# Patient Record
Sex: Female | Born: 1970 | Race: Black or African American | Hispanic: No | Marital: Single
Health system: Southern US, Community
[De-identification: ages and names within clinical notes are randomized; demographics above are authoritative.]

---

## 1998-12-06 ENCOUNTER — Ambulatory Visit (HOSPITAL_COMMUNITY): Admission: RE | Admit: 1998-12-06 | Discharge: 1998-12-06 | Payer: Self-pay | Admitting: Family Medicine

## 1998-12-06 ENCOUNTER — Encounter: Payer: Self-pay | Admitting: Family Medicine

## 1999-01-20 ENCOUNTER — Encounter: Payer: Self-pay | Admitting: Family Medicine

## 1999-01-20 ENCOUNTER — Ambulatory Visit (HOSPITAL_COMMUNITY): Admission: RE | Admit: 1999-01-20 | Discharge: 1999-01-20 | Payer: Self-pay | Admitting: Family Medicine

## 1999-02-09 ENCOUNTER — Encounter: Payer: Self-pay | Admitting: Neurology

## 1999-02-09 ENCOUNTER — Ambulatory Visit (HOSPITAL_COMMUNITY): Admission: RE | Admit: 1999-02-09 | Discharge: 1999-02-09 | Payer: Self-pay | Admitting: Neurology

## 1999-02-16 ENCOUNTER — Encounter: Admission: RE | Admit: 1999-02-16 | Discharge: 1999-05-16 | Payer: Self-pay | Admitting: Anesthesiology

## 1999-05-16 ENCOUNTER — Encounter: Admission: RE | Admit: 1999-05-16 | Discharge: 1999-08-14 | Payer: Self-pay | Admitting: Anesthesiology

## 1999-05-25 ENCOUNTER — Encounter: Payer: Self-pay | Admitting: Family Medicine

## 1999-05-25 ENCOUNTER — Ambulatory Visit (HOSPITAL_COMMUNITY): Admission: RE | Admit: 1999-05-25 | Discharge: 1999-05-25 | Payer: Self-pay | Admitting: Family Medicine

## 1999-08-19 ENCOUNTER — Encounter: Admission: RE | Admit: 1999-08-19 | Discharge: 1999-11-17 | Payer: Self-pay | Admitting: Anesthesiology

## 1999-12-09 ENCOUNTER — Encounter: Admission: RE | Admit: 1999-12-09 | Discharge: 2000-03-08 | Payer: Self-pay | Admitting: Anesthesiology

## 2000-04-09 ENCOUNTER — Encounter: Admission: RE | Admit: 2000-04-09 | Discharge: 2000-07-08 | Payer: Self-pay | Admitting: Anesthesiology

## 2000-07-03 ENCOUNTER — Encounter: Admission: RE | Admit: 2000-07-03 | Discharge: 2000-10-01 | Payer: Self-pay | Admitting: Anesthesiology

## 2000-08-06 ENCOUNTER — Encounter: Admission: RE | Admit: 2000-08-06 | Discharge: 2000-11-04 | Payer: Self-pay | Admitting: Anesthesiology

## 2000-11-02 ENCOUNTER — Encounter: Admission: RE | Admit: 2000-11-02 | Discharge: 2001-01-31 | Payer: Self-pay | Admitting: Anesthesiology

## 2001-02-04 ENCOUNTER — Encounter: Admission: RE | Admit: 2001-02-04 | Discharge: 2001-05-05 | Payer: Self-pay | Admitting: Anesthesiology

## 2001-05-27 ENCOUNTER — Encounter: Admission: RE | Admit: 2001-05-27 | Discharge: 2001-06-29 | Payer: Self-pay | Admitting: Anesthesiology

## 2004-05-11 ENCOUNTER — Ambulatory Visit (HOSPITAL_COMMUNITY): Admission: RE | Admit: 2004-05-11 | Discharge: 2004-05-11 | Payer: Self-pay | Admitting: Preventative Medicine

## 2004-05-12 ENCOUNTER — Ambulatory Visit (HOSPITAL_COMMUNITY): Admission: RE | Admit: 2004-05-12 | Discharge: 2004-05-12 | Payer: Self-pay | Admitting: Urology

## 2004-06-28 ENCOUNTER — Ambulatory Visit (HOSPITAL_COMMUNITY): Admission: RE | Admit: 2004-06-28 | Discharge: 2004-06-28 | Payer: Self-pay | Admitting: Urology

## 2004-12-13 ENCOUNTER — Ambulatory Visit (HOSPITAL_COMMUNITY): Admission: RE | Admit: 2004-12-13 | Discharge: 2004-12-13 | Payer: Self-pay | Admitting: Urology

## 2011-03-23 ENCOUNTER — Other Ambulatory Visit (HOSPITAL_COMMUNITY): Payer: Self-pay | Admitting: Ophthalmology

## 2011-03-23 ENCOUNTER — Other Ambulatory Visit: Payer: Self-pay | Admitting: Internal Medicine

## 2011-03-23 DIAGNOSIS — H5347 Heteronymous bilateral field defects: Secondary | ICD-10-CM

## 2011-03-30 ENCOUNTER — Ambulatory Visit (HOSPITAL_COMMUNITY)
Admission: RE | Admit: 2011-03-30 | Discharge: 2011-03-30 | Disposition: A | Discharge status: Home / Self Care | Payer: 59 | Source: Ambulatory Visit | Attending: Ophthalmology | Admitting: Ophthalmology

## 2011-03-30 DIAGNOSIS — G939 Disorder of brain, unspecified: Secondary | ICD-10-CM | POA: Insufficient documentation

## 2011-03-30 DIAGNOSIS — H538 Other visual disturbances: Secondary | ICD-10-CM | POA: Insufficient documentation

## 2011-03-30 DIAGNOSIS — H5347 Heteronymous bilateral field defects: Secondary | ICD-10-CM

## 2011-03-30 MED ORDER — GADOBENATE DIMEGLUMINE 529 MG/ML IV SOLN: 9.0000 mL | Freq: Once | INTRAVENOUS | Status: AC | PRN

## 2011-06-09 ENCOUNTER — Encounter (HOSPITAL_COMMUNITY)
Admission: RE | Admit: 2011-06-09 | Discharge: 2011-06-09 | Disposition: A | Discharge status: Home / Self Care | Payer: 59 | Source: Ambulatory Visit | Attending: Neurosurgery | Admitting: Neurosurgery

## 2011-06-09 LAB — COMPREHENSIVE METABOLIC PANEL
ALT: 15 U/L (ref 0–35)
AST: 13 U/L (ref 0–37)
Alkaline Phosphatase: 59 U/L (ref 39–117)
CO2: 33 mEq/L — ABNORMAL HIGH (ref 19–32)
Glucose, Bld: 87 mg/dL (ref 70–99)
Sodium: 142 mEq/L (ref 135–145)
Total Bilirubin: 0.2 mg/dL — ABNORMAL LOW (ref 0.3–1.2)

## 2011-06-09 LAB — CBC
HCT: 32.4 % — ABNORMAL LOW (ref 36.0–46.0)
MCH: 31.3 pg (ref 26.0–34.0)
MCHC: 34.9 g/dL (ref 30.0–36.0)
Platelets: 232 10*3/uL (ref 150–400)
RDW: 13.4 % (ref 11.5–15.5)
WBC: 12.5 10*3/uL — ABNORMAL HIGH (ref 4.0–10.5)

## 2011-06-09 LAB — ABO/RH: ABO/RH(D): B POS

## 2011-06-09 LAB — TYPE AND SCREEN

## 2011-06-19 ENCOUNTER — Inpatient Hospital Stay (HOSPITAL_COMMUNITY)
Admission: RE | Admit: 2011-06-19 | Discharge: 2011-06-23 | DRG: 614 | Disposition: A | Discharge status: Home / Self Care | Payer: 59 | Source: Ambulatory Visit | Attending: Neurosurgery | Admitting: Neurosurgery

## 2011-06-19 ENCOUNTER — Other Ambulatory Visit: Payer: Self-pay | Admitting: Neurosurgery

## 2011-06-19 DIAGNOSIS — D352 Benign neoplasm of pituitary gland: Principal | ICD-10-CM | POA: Diagnosis present

## 2011-06-19 DIAGNOSIS — Z6841 Body Mass Index (BMI) 40.0 and over, adult: Secondary | ICD-10-CM

## 2011-06-19 DIAGNOSIS — Z01812 Encounter for preprocedural laboratory examination: Secondary | ICD-10-CM

## 2011-06-19 DIAGNOSIS — E669 Obesity, unspecified: Secondary | ICD-10-CM | POA: Diagnosis present

## 2011-06-19 DIAGNOSIS — R112 Nausea with vomiting, unspecified: Secondary | ICD-10-CM | POA: Diagnosis present

## 2011-06-19 DIAGNOSIS — E079 Disorder of thyroid, unspecified: Secondary | ICD-10-CM | POA: Diagnosis present

## 2011-06-19 DIAGNOSIS — R51 Headache: Secondary | ICD-10-CM | POA: Diagnosis present

## 2011-06-20 LAB — BASIC METABOLIC PANEL
BUN: 20 mg/dL (ref 6–23)
CO2: 30 mEq/L (ref 19–32)
Calcium: 9.5 mg/dL (ref 8.4–10.5)
Calcium: 9.7 mg/dL (ref 8.4–10.5)
Chloride: 99 mEq/L (ref 96–112)
Chloride: 106 mEq/L (ref 96–112)
Creatinine, Ser: 0.86 mg/dL (ref 0.50–1.10)
Creatinine, Ser: 0.95 mg/dL (ref 0.50–1.10)
GFR calc non Af Amer: >60 mL/min (ref >60)
Glucose, Bld: 143 mg/dL — ABNORMAL HIGH (ref 70–99)

## 2011-06-20 LAB — OSMOLALITY: Osmolality: 288 mOsm/kg (ref 275–300)

## 2011-06-21 LAB — OSMOLALITY: Osmolality: 319 mOsm/kg — ABNORMAL HIGH (ref 275–300)

## 2011-06-21 LAB — BASIC METABOLIC PANEL
CO2: 34 mEq/L — ABNORMAL HIGH (ref 19–32)
CO2: 34 mEq/L — ABNORMAL HIGH (ref 19–32)
Calcium: 9.1 mg/dL (ref 8.4–10.5)
Chloride: 107 mEq/L (ref 96–112)
Creatinine, Ser: 0.83 mg/dL (ref 0.50–1.10)
GFR calc Af Amer: >60 mL/min (ref >60)
GFR calc Af Amer: >60 mL/min (ref >60)
GFR calc non Af Amer: >60 mL/min (ref >60)
GFR calc non Af Amer: >60 mL/min (ref >60)
Glucose, Bld: 128 mg/dL — ABNORMAL HIGH (ref 70–99)
Sodium: 152 mEq/L — ABNORMAL HIGH (ref 135–145)
Sodium: 147 mEq/L — ABNORMAL HIGH (ref 135–145)

## 2011-06-22 LAB — BASIC METABOLIC PANEL
BUN: 19 mg/dL (ref 6–23)
CO2: 34 mEq/L — ABNORMAL HIGH (ref 19–32)
Chloride: 104 mEq/L (ref 96–112)
Creatinine, Ser: 0.82 mg/dL (ref 0.50–1.10)
Creatinine, Ser: 0.78 mg/dL (ref 0.50–1.10)
Sodium: 144 mEq/L (ref 135–145)

## 2011-06-22 LAB — OSMOLALITY: Osmolality: 298 mOsm/kg (ref 275–300)

## 2011-06-23 LAB — BASIC METABOLIC PANEL
BUN: 17 mg/dL (ref 6–23)
CO2: 34 mEq/L — ABNORMAL HIGH (ref 19–32)
Calcium: 9.5 mg/dL (ref 8.4–10.5)
Creatinine, Ser: 0.8 mg/dL (ref 0.50–1.10)
GFR calc non Af Amer: >60 mL/min (ref >60)
Glucose, Bld: 92 mg/dL (ref 70–99)
Potassium: 4 mEq/L (ref 3.5–5.1)

## 2011-06-23 LAB — OSMOLALITY, URINE: Osmolality, Ur: 326 mOsm/kg — ABNORMAL LOW (ref 390–1090)

## 2011-06-26 NOTE — Op Note (Signed)
[name], [name] NO.:  [id number]  MEDICAL RECORD NO.:  [id number]  LOCATION:  3109                         FACILITY:  [facility]  PHYSICIAN:  Cristi Loron, M.D.DATE OF BIRTH:  Nov 19, 1970  DATE OF PROCEDURE:  06/19/2011 DATE OF DISCHARGE:                              OPERATIVE REPORT   BRIEF HISTORY:  The patient is a 40 year old black female who has noted vision loss.  She was worked up with a brain MRI which demonstrated a large sellar and suprasellar tumor.  I discussed various treatment options with the patient.  She underwent an endocrinologic and ophthalmologic workup.  I recommend she undergo a transsphenoidal resection of the tumor.  The patient has outweighed the risks, benefits and alternatives of surgery and decided to proceed with the operation.  PREOPERATIVE DIAGNOSIS:  Pituitary tumor.  POSTOPERATIVE DIAGNOSIS:  Pituitary tumor.  PROCEDURE:  Transsphenoidal resection of pituitary tumor using microdissection.  SURGEON:  Cristi Loron, MD, Kristine Garbe. Ezzard Standing, MD.  ASSISTANT:  None.  ANESTHESIA:  General endotracheal.  ESTIMATED BLOOD LOSS:  100 mL.  SPECIMENS:  Tumor.  DRAINS:  None.  COMPLICATIONS:  None.  DESCRIPTION OF PROCEDURE:  The patient was brought to the operating room by the anesthesia team.  General endotracheal anesthesia was induced. The patient remained in supine position.  The [place] three-point headrest was applied over calvarium.  The patient was positioned with the neck slightly extended.  At this point, Dr. Ezzard Standing performed exposure surgery.  So for further details for the operation, please refer to his operative note.  After Dr. Ezzard Standing had placed a retractor and exposed the sphenoid sinus and the floor of sella, I took over the operation.  I carefully dissected through down to the floor of the sella.  The floor was quite thin with a thin rim of bone.  I removed this using with a  pituitary forceps as well as the Kerrison punches exposing the dura.  I then incised the dura in a cruciate fashion with the 15 blade scalpel, medially protruding was some tumor.  We obtained multiple specimens and sent to the pathologist for permanent section.  I then used the suction irrigation and the various curettes to perform a gross total resection of the tumor.  We carefully dissected with the curettes and we reassessed this and removed all tumor.  Upon doing this, we could see the arachnoid "poaching" down into the sella.  We then obtained hemostasis using electrocautery and  irrigated the wound with bacitracin solution.  We did not see any spinal fluid leakage.  I made an incision in the patient's right upper quadrant.  Removed a piece of adipose tissue from the cutaneous space.  We then placed this fat in the sella and at this point Dr. Ezzard Standing took over the operation and performed the closure.  After the closure, the patient was subsequently returned to anesthesia team and transported to the postanesthesia care unit in stablecondition.  All sponge, instrument, and needle counts were correct at the end of this case.     Cristi Loron, M.D.     JDJ/MEDQ  D:  06/19/2011  T:  06/20/2011  Job:  [id number]  Electronically Signed by Tressie Stalker M.D. on 06/26/2011 07:41:45 AM

## 2011-07-13 NOTE — Discharge Summary (Signed)
  NAMEJAELLA, Erica [name] NO.:  [id number]  MEDICAL RECORD NO.:  [id number]  LOCATION:  3036                         FACILITY:  [facility]  PHYSICIAN:  Cristi Loron, M.D.DATE OF BIRTH:  24-Jan-1971  DATE OF ADMISSION:  06/19/2011 DATE OF DISCHARGE:  06/23/2011                              DISCHARGE SUMMARY   BRIEF HISTORY:  The patient is a 39 year old black female who was noted to have visual loss.  She was worked up with a brain MRI, which demonstrated a large sellar and suprasellar lesion.  I discussed the various treatment with the patient.  The patient underwent endocrinologic and ophthalmologic workup.  I recommend she undergo transsphenoidal resection of the tumor.  The patient has weighed the risks, benefits and alternatives of surgery and decided to proceed with the operation.  For further details of this admission, please refer to typed history and physical.  HOSPITAL COURSE:  I admitted the patient to [facility] on June 19, 2011.  On the day of admission, Dr. Narda Bonds and I performed a gross-total resection of the tumor via a transsphenoidal approach.  The surgery went well (for full details of this operation, please refer to typed and operative note).  POSTOPERATIVE COURSE:  The patient's postoperative course was as follows.  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 intake.  We consulted to Dr. Talmage Nap who saw the patient and started her on 0.1 mg of DDAVP p.o. b.i.d.  The remainder of the patient's hospital course was unremarkable.  She was discharged to home on June 23, 2011.  DISCHARGE INSTRUCTIONS:  The patient was instructed to follow up with Dr. Talmage Nap next week, follow up me in 2 weeks, and follow up with Dr. Ezzard Standing per his instructions.  FINAL DIAGNOSIS:  Pituitary macroadenoma.  PROCEDURE PERFORMED:  Transsphenoidal resection  of pituitary macroadenoma using microdissection.     Cristi Loron, M.D.     JDJ/MEDQ  D:  07/06/2011  T:  07/06/2011  Job:  [id number]  cc:   Dorisann Frames, M.D.  Electronically Signed by Tressie Stalker M.D. on 07/13/2011 09:00:05 PM

## 2011-07-27 NOTE — Op Note (Signed)
Erica [name], [name]               ACCOUNT NO.:  [id number]  MEDICAL RECORD NO.:  [id number]  LOCATION:  3109                         FACILITY:  [facility]  PHYSICIAN:  Kristine Garbe. Ezzard Standing, M.D.DATE OF BIRTH:  12-31-1970  DATE OF PROCEDURE:  06/19/2011 DATE OF DISCHARGE:                              OPERATIVE REPORT   PREOPERATIVE DIAGNOSIS:  Pituitary microadenoma.  POSTOPERATIVE DIAGNOSIS:  Pituitary microadenoma.  OPERATION PERFORMED:  The transseptal transsphenoidal resection of pituitary adenoma.  SURGEON:  Kristine Garbe. Ezzard Standing, MD  CO-SURGEON:  Cristi Loron, MD  ANESTHESIA:  General endotracheal.  COMPLICATIONS:  None.  CLINICAL NOTE:  Erica [name] is a 40 year old black female who started developing some headaches and visual changes in February of this past year.  She was initially seen by optometrist and treated with glasses. She noticed some progressive change in her vision with some bilateraltemporal hemianopsia on followup evaluation.  She underwent an MRI scan which showed a large pituitary mass, consistent with probable pituitary macroadenoma.  She had some extension of the mass in the left cavernous sinus with finding of the optic chiasm.  She was taken to the operating room at this time by Dr. Lovell Sheehan and myself for transeptal transsphenoidal resection of pituitary adenoma.  DESCRIPTION OF PROCEDURE:  The patient was brought to the operating room and was positioned by Dr. Lovell Sheehan and tongs to secure head.  The nose and abdomen was prepped with Betadine solution and draped out with sterile towels.  Nose was then further prepped with cotton pledgets soaked in Afrin and septum and floor of the nose was injected with Xylocaine with epinephrine for hemostasis.  I performed the initial procedure with exposure of the sella.  I first made an extended incision along the cartilaginous septum on the right side, extended down to the floor of the nose.   Mucoperichondrial and mucoperiosteal flaps were elevated off the right side of the septum and extended down to the floor of the nose.  Additional elevation was performed along the left floor of the nose.  The anterior 2-3 cm of cartilaginous septum which was preserved was dislocated off the maxillary crest and pushed into the left airway and then the mucoperiosteal flaps were elevated off either side of the posterior bony septum.  Bony septum was removed back to the base of the sphenoid sinus.  [facility] retractors were then placed. Using the 2-mm osteotome, the sphenoidotomy was performed.  The sphenoidotomy was enlarged to expose the entire sphenoid sinus in the sellar mass to the roof of the sphenoid sinus.  At this point, Dr. Lovell Sheehan performed a resection of the pituitary mass and following resection of the pituitary mass and filling the sella cavity and sphenoid sinus with previously harvested abdominal fat.  I was called back in to close the approach.  The cartilaginous septum was placed back on the maxillary crest in midline.  The hemitransfixion incision was closed with interrupted 4-0 chromic sutures.  The septum was then basted with a 3-0 chromic suture.  Dural splints were secured either side of the septum with a 4-0 nylon suture.  Elongated Merocel packs were placed in either side of the septum and soaked in  bacitracin ointment, hydrated with saline.  Oropharynx was suctioned of any remaining blood.  This completed the procedure.  [name] was awoken from anesthesia and transferred to the recovery room.  Postoperatively, doing well.  DISPOSITION:  She will be admitted for observation in the NICU.  We will plan on removing the Merocel nasal packs in 2-3 days.          ______________________________ Kristine Garbe Ezzard Standing, M.D.     CEN/MEDQ  D:  06/19/2011  T:  06/19/2011  Job:  [id number]  cc:   Cristi Loron, M.D.  Electronically Signed by Dillard Cannon M.D. on  07/27/2011 01:28:52 AM

## 2011-09-13 ENCOUNTER — Other Ambulatory Visit: Payer: Self-pay | Admitting: Neurosurgery

## 2011-09-13 DIAGNOSIS — D497 Neoplasm of unspecified behavior of endocrine glands and other parts of nervous system: Secondary | ICD-10-CM

## 2011-09-18 ENCOUNTER — Ambulatory Visit
Admission: RE | Admit: 2011-09-18 | Discharge: 2011-09-18 | Disposition: A | Discharge status: Home / Self Care | Payer: 59 | Source: Ambulatory Visit | Attending: Neurosurgery | Admitting: Neurosurgery

## 2011-09-18 DIAGNOSIS — D497 Neoplasm of unspecified behavior of endocrine glands and other parts of nervous system: Secondary | ICD-10-CM

## 2011-09-18 MED ORDER — GADOBENATE DIMEGLUMINE 529 MG/ML IV SOLN
9.0000 mL | Freq: Once | INTRAVENOUS | Status: AC | PRN
  Administered 2011-09-18: 9 mL via INTRAVENOUS

## 2012-08-10 ENCOUNTER — Ambulatory Visit (HOSPITAL_COMMUNITY)
Admission: RE | Admit: 2012-08-10 | Discharge: 2012-08-10 | Disposition: A | Discharge status: Home / Self Care | Payer: 59 | Source: Ambulatory Visit | Attending: Internal Medicine | Admitting: Internal Medicine

## 2012-08-10 ENCOUNTER — Other Ambulatory Visit (HOSPITAL_COMMUNITY): Payer: Self-pay | Admitting: Internal Medicine

## 2012-08-10 DIAGNOSIS — R05 Cough: Secondary | ICD-10-CM

## 2012-08-10 DIAGNOSIS — R059 Cough, unspecified: Secondary | ICD-10-CM | POA: Insufficient documentation

## 2012-08-10 DIAGNOSIS — R053 Chronic cough: Secondary | ICD-10-CM

## 2012-08-12 ENCOUNTER — Other Ambulatory Visit (HOSPITAL_COMMUNITY): Payer: Self-pay | Admitting: Internal Medicine

## 2012-08-12 DIAGNOSIS — Z0000 Encounter for general adult medical examination without abnormal findings: Secondary | ICD-10-CM

## 2012-08-19 ENCOUNTER — Ambulatory Visit (HOSPITAL_COMMUNITY)
Admission: RE | Admit: 2012-08-19 | Discharge: 2012-08-19 | Disposition: A | Discharge status: Home / Self Care | Payer: 59 | Source: Ambulatory Visit | Attending: Internal Medicine | Admitting: Internal Medicine

## 2012-08-19 ENCOUNTER — Ambulatory Visit (HOSPITAL_COMMUNITY): Payer: 59

## 2012-08-19 DIAGNOSIS — Z1231 Encounter for screening mammogram for malignant neoplasm of breast: Secondary | ICD-10-CM | POA: Insufficient documentation

## 2012-08-19 DIAGNOSIS — Z0000 Encounter for general adult medical examination without abnormal findings: Secondary | ICD-10-CM

## 2013-05-01 ENCOUNTER — Other Ambulatory Visit: Payer: Self-pay | Admitting: Oncology

## 2015-07-14 ENCOUNTER — Other Ambulatory Visit: Payer: Self-pay | Admitting: *Deleted

## 2016-02-10 ENCOUNTER — Other Ambulatory Visit (HOSPITAL_COMMUNITY): Payer: Self-pay | Admitting: Registered Nurse

## 2016-02-10 ENCOUNTER — Ambulatory Visit (HOSPITAL_COMMUNITY)
Admission: RE | Admit: 2016-02-10 | Discharge: 2016-02-10 | Disposition: A | Discharge status: Home / Self Care | Payer: BLUE CROSS/BLUE SHIELD | Source: Ambulatory Visit | Attending: Registered Nurse | Admitting: Registered Nurse

## 2016-02-10 DIAGNOSIS — R918 Other nonspecific abnormal finding of lung field: Secondary | ICD-10-CM | POA: Diagnosis not present

## 2016-02-10 DIAGNOSIS — R109 Unspecified abdominal pain: Secondary | ICD-10-CM | POA: Diagnosis not present

## 2016-02-10 DIAGNOSIS — Z6841 Body Mass Index (BMI) 40.0 and over, adult: Secondary | ICD-10-CM | POA: Diagnosis not present

## 2016-02-10 DIAGNOSIS — R350 Frequency of micturition: Secondary | ICD-10-CM | POA: Diagnosis not present

## 2016-02-10 DIAGNOSIS — E039 Hypothyroidism, unspecified: Secondary | ICD-10-CM | POA: Diagnosis not present

## 2016-02-10 DIAGNOSIS — Z1389 Encounter for screening for other disorder: Secondary | ICD-10-CM | POA: Diagnosis not present

## 2016-02-10 DIAGNOSIS — N200 Calculus of kidney: Secondary | ICD-10-CM | POA: Diagnosis not present

## 2016-02-10 DIAGNOSIS — E119 Type 2 diabetes mellitus without complications: Secondary | ICD-10-CM | POA: Diagnosis not present

## 2016-02-10 NOTE — Progress Notes (Signed)
Report called to Ria Comment at [facility]. She will give results to NP that ordered exam. She said the report is negative and the patient can go home.

## 2016-02-28 DIAGNOSIS — R109 Unspecified abdominal pain: Secondary | ICD-10-CM | POA: Diagnosis not present

## 2016-02-28 DIAGNOSIS — R351 Nocturia: Secondary | ICD-10-CM | POA: Diagnosis not present

## 2016-02-28 DIAGNOSIS — R1030 Lower abdominal pain, unspecified: Secondary | ICD-10-CM | POA: Diagnosis not present

## 2016-02-28 DIAGNOSIS — N200 Calculus of kidney: Secondary | ICD-10-CM | POA: Diagnosis not present

## 2016-03-06 DIAGNOSIS — R918 Other nonspecific abnormal finding of lung field: Secondary | ICD-10-CM | POA: Diagnosis not present

## 2016-03-06 DIAGNOSIS — N200 Calculus of kidney: Secondary | ICD-10-CM | POA: Diagnosis not present

## 2016-03-06 DIAGNOSIS — N281 Cyst of kidney, acquired: Secondary | ICD-10-CM | POA: Diagnosis not present

## 2016-03-06 DIAGNOSIS — R109 Unspecified abdominal pain: Secondary | ICD-10-CM | POA: Diagnosis not present

## 2016-03-06 DIAGNOSIS — R1030 Lower abdominal pain, unspecified: Secondary | ICD-10-CM | POA: Diagnosis not present

## 2016-03-20 DIAGNOSIS — R350 Frequency of micturition: Secondary | ICD-10-CM | POA: Diagnosis not present

## 2016-03-20 DIAGNOSIS — R351 Nocturia: Secondary | ICD-10-CM | POA: Diagnosis not present

## 2016-03-20 DIAGNOSIS — N281 Cyst of kidney, acquired: Secondary | ICD-10-CM | POA: Diagnosis not present

## 2016-03-29 DIAGNOSIS — N2889 Other specified disorders of kidney and ureter: Secondary | ICD-10-CM | POA: Diagnosis not present

## 2016-04-21 DIAGNOSIS — R350 Frequency of micturition: Secondary | ICD-10-CM | POA: Diagnosis not present

## 2016-04-21 DIAGNOSIS — R351 Nocturia: Secondary | ICD-10-CM | POA: Diagnosis not present

## 2016-04-21 DIAGNOSIS — N281 Cyst of kidney, acquired: Secondary | ICD-10-CM | POA: Diagnosis not present

## 2016-05-22 DIAGNOSIS — R351 Nocturia: Secondary | ICD-10-CM | POA: Diagnosis not present

## 2016-05-22 DIAGNOSIS — R350 Frequency of micturition: Secondary | ICD-10-CM | POA: Diagnosis not present

## 2017-01-11 DIAGNOSIS — F4320 Adjustment disorder, unspecified: Secondary | ICD-10-CM | POA: Diagnosis not present

## 2017-01-11 DIAGNOSIS — Z681 Body mass index (BMI) 19 or less, adult: Secondary | ICD-10-CM | POA: Diagnosis not present

## 2017-02-08 DIAGNOSIS — Z1389 Encounter for screening for other disorder: Secondary | ICD-10-CM | POA: Diagnosis not present

## 2017-02-08 DIAGNOSIS — F4320 Adjustment disorder, unspecified: Secondary | ICD-10-CM | POA: Diagnosis not present

## 2017-02-08 DIAGNOSIS — Z6841 Body Mass Index (BMI) 40.0 and over, adult: Secondary | ICD-10-CM | POA: Diagnosis not present

## 2017-03-15 DIAGNOSIS — E119 Type 2 diabetes mellitus without complications: Secondary | ICD-10-CM | POA: Diagnosis not present

## 2017-03-15 DIAGNOSIS — Z6841 Body Mass Index (BMI) 40.0 and over, adult: Secondary | ICD-10-CM | POA: Diagnosis not present

## 2017-03-15 DIAGNOSIS — J069 Acute upper respiratory infection, unspecified: Secondary | ICD-10-CM | POA: Diagnosis not present

## 2017-03-15 DIAGNOSIS — F4320 Adjustment disorder, unspecified: Secondary | ICD-10-CM | POA: Diagnosis not present

## 2017-03-15 DIAGNOSIS — Z1389 Encounter for screening for other disorder: Secondary | ICD-10-CM | POA: Diagnosis not present

## 2017-03-15 DIAGNOSIS — J209 Acute bronchitis, unspecified: Secondary | ICD-10-CM | POA: Diagnosis not present

## 2017-03-20 DIAGNOSIS — Z1389 Encounter for screening for other disorder: Secondary | ICD-10-CM | POA: Diagnosis not present

## 2017-03-20 DIAGNOSIS — J069 Acute upper respiratory infection, unspecified: Secondary | ICD-10-CM | POA: Diagnosis not present

## 2017-03-20 DIAGNOSIS — Z6841 Body Mass Index (BMI) 40.0 and over, adult: Secondary | ICD-10-CM | POA: Diagnosis not present

## 2017-03-20 DIAGNOSIS — J209 Acute bronchitis, unspecified: Secondary | ICD-10-CM | POA: Diagnosis not present

## 2017-03-20 DIAGNOSIS — E119 Type 2 diabetes mellitus without complications: Secondary | ICD-10-CM | POA: Diagnosis not present

## 2017-05-28 DIAGNOSIS — E232 Diabetes insipidus: Secondary | ICD-10-CM | POA: Diagnosis not present

## 2017-05-28 DIAGNOSIS — E2749 Other adrenocortical insufficiency: Secondary | ICD-10-CM | POA: Diagnosis not present

## 2017-05-28 DIAGNOSIS — E039 Hypothyroidism, unspecified: Secondary | ICD-10-CM | POA: Diagnosis not present

## 2017-06-04 DIAGNOSIS — E119 Type 2 diabetes mellitus without complications: Secondary | ICD-10-CM | POA: Diagnosis not present

## 2017-06-04 DIAGNOSIS — E2749 Other adrenocortical insufficiency: Secondary | ICD-10-CM | POA: Diagnosis not present

## 2017-06-04 DIAGNOSIS — E039 Hypothyroidism, unspecified: Secondary | ICD-10-CM | POA: Diagnosis not present

## 2017-06-04 DIAGNOSIS — E232 Diabetes insipidus: Secondary | ICD-10-CM | POA: Diagnosis not present

## 2017-12-17 DIAGNOSIS — J209 Acute bronchitis, unspecified: Secondary | ICD-10-CM | POA: Diagnosis not present

## 2017-12-17 DIAGNOSIS — Z1389 Encounter for screening for other disorder: Secondary | ICD-10-CM | POA: Diagnosis not present

## 2017-12-17 DIAGNOSIS — J22 Unspecified acute lower respiratory infection: Secondary | ICD-10-CM | POA: Diagnosis not present

## 2017-12-17 DIAGNOSIS — Z6841 Body Mass Index (BMI) 40.0 and over, adult: Secondary | ICD-10-CM | POA: Diagnosis not present

## 2018-03-05 IMAGING — CT CT RENAL STONE PROTOCOL
2 of 4 series · 17 of 46 positions shown, 19 images · non-contrast
Comparison: CT scan of May 11, 2004.

CLINICAL DATA: Acute left flank pain.

EXAM:
CT ABDOMEN AND PELVIS WITHOUT CONTRAST
TECHNIQUE: Multidetector CT imaging of the abdomen and pelvis was performed
following the standard protocol without IV contrast.

[Series 2: standard/full over (age)lbs 5.0 · axial · 0.80mm/px · z∈[-694,-260]mm · 14 of 95 slices shown, 16 images]
[im 4/95  soft-tissue]
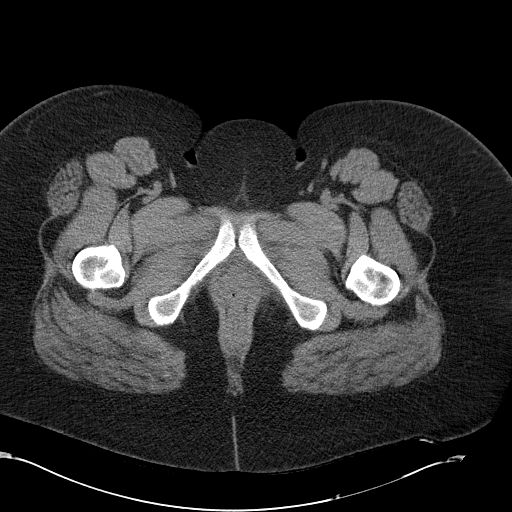
[im 4/95  bone]
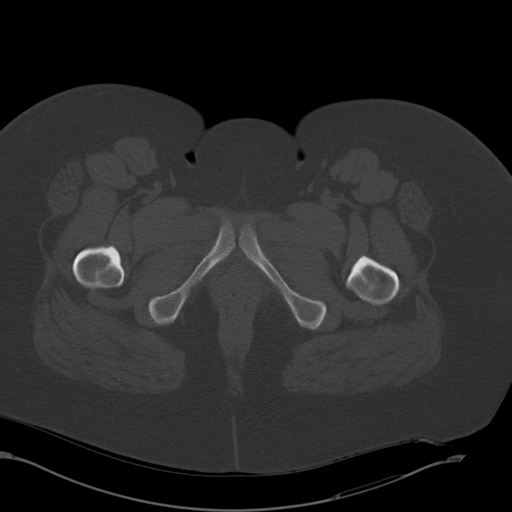
[im 12/95  soft-tissue]
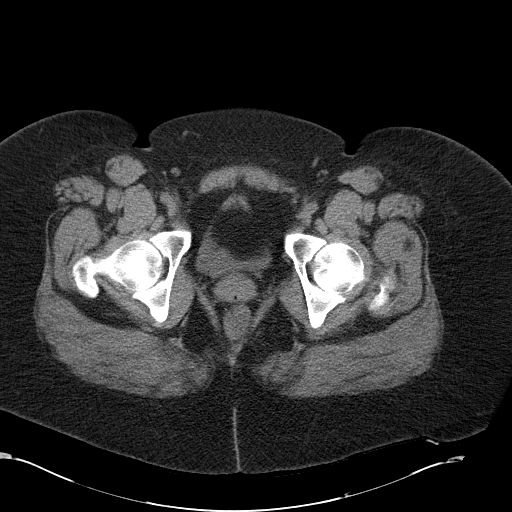
[im 20/95  soft-tissue]
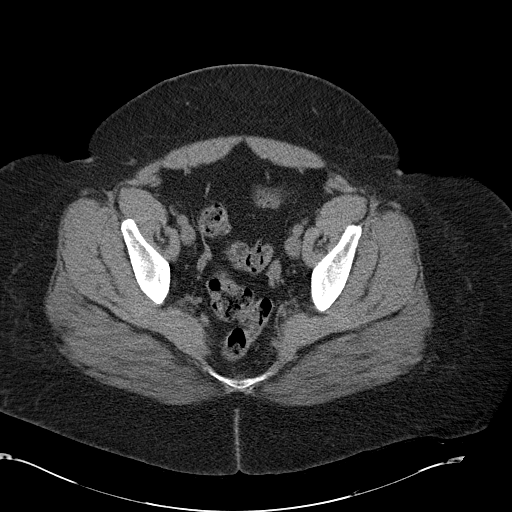
[im 24/95  soft-tissue]
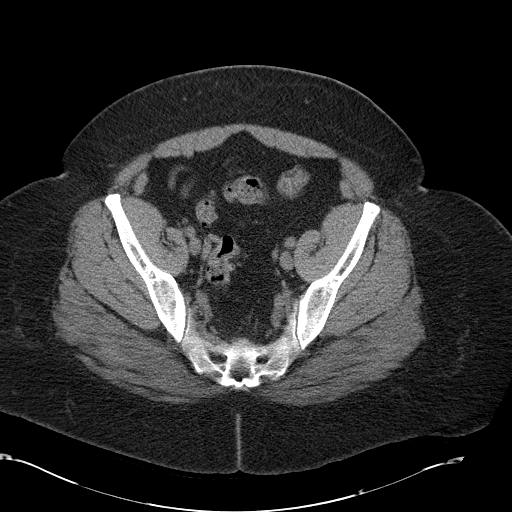
[im 32/95  soft-tissue]
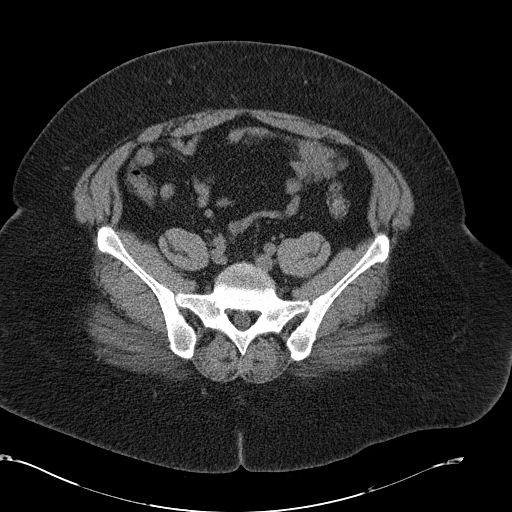
[im 40/95  soft-tissue]
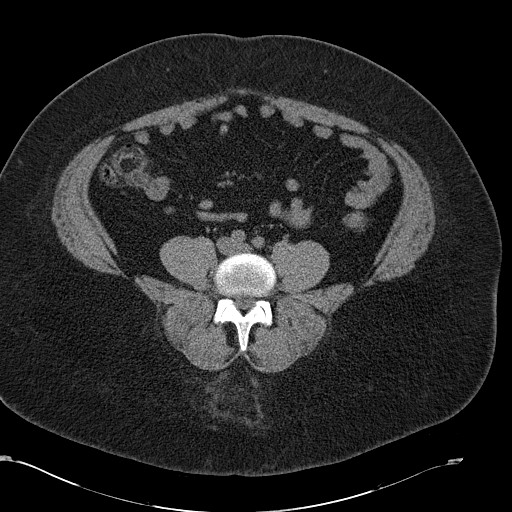
[im 44/95  soft-tissue]
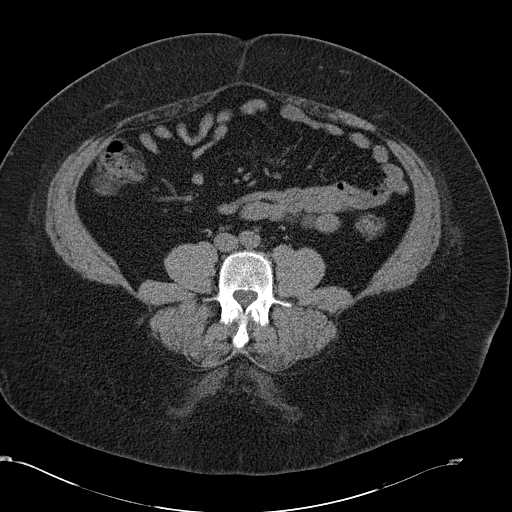
[im 51/95  soft-tissue]
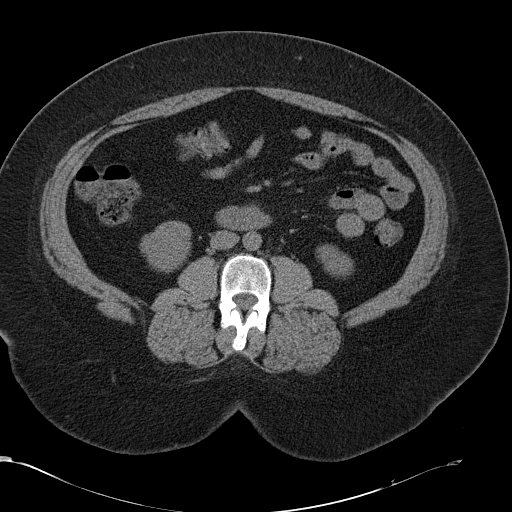
[im 55/95  soft-tissue]
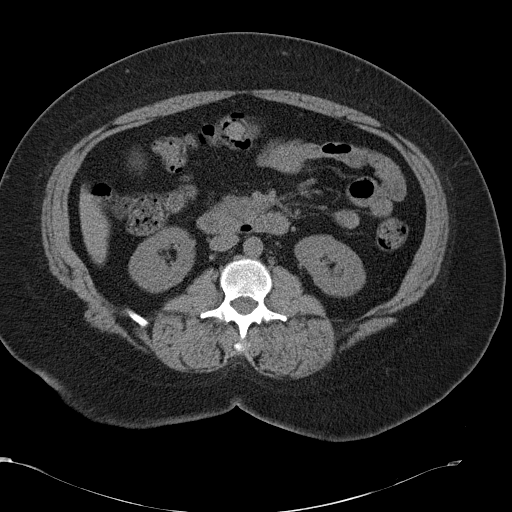
[im 55/95  bone]
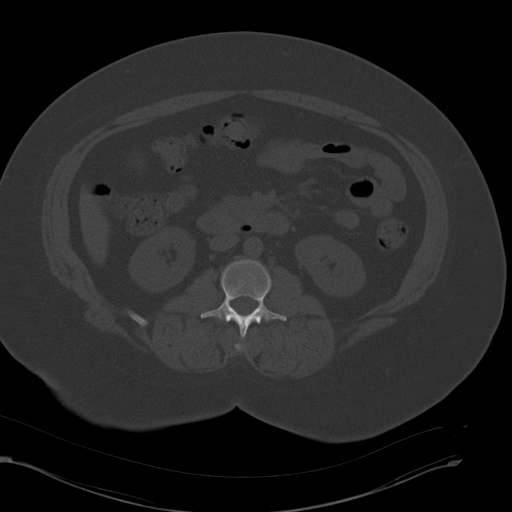
[im 63/95  soft-tissue]
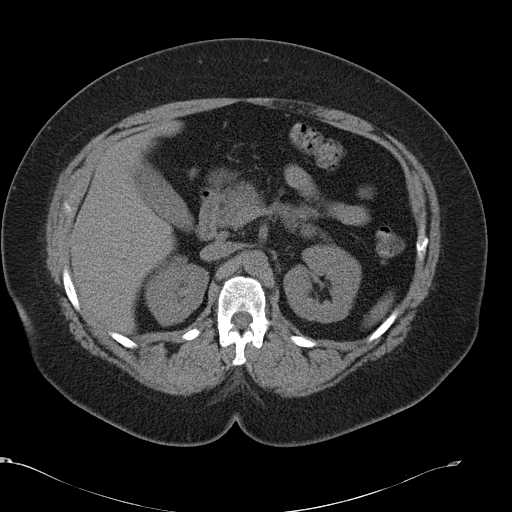
[im 71/95  soft-tissue]
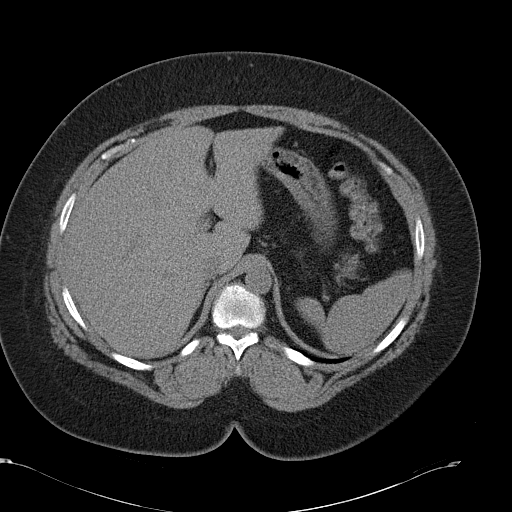
[im 75/95  soft-tissue]
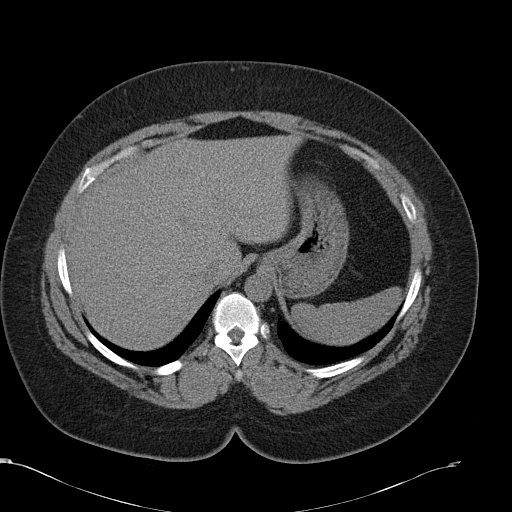
[im 83/95  soft-tissue]
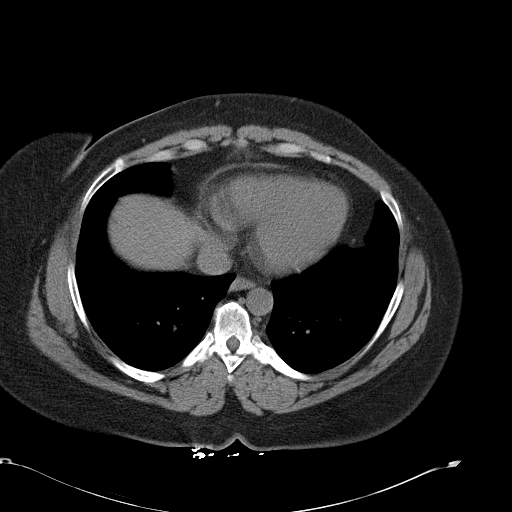
[im 91/95  soft-tissue]
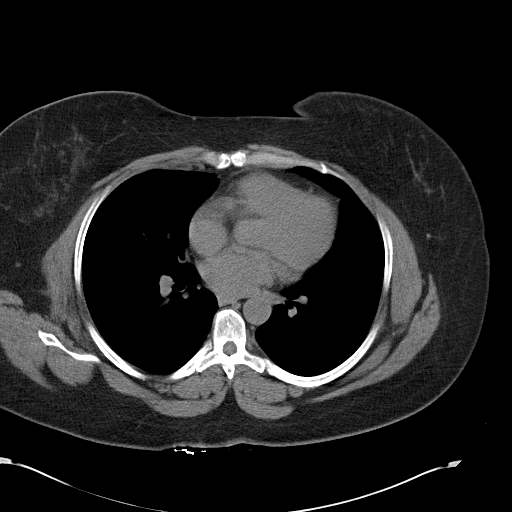

[Series 3: mpr coronal · coronal · 0.93mm/px · 3 of 102 slices shown]
[im 34/102  soft-tissue]
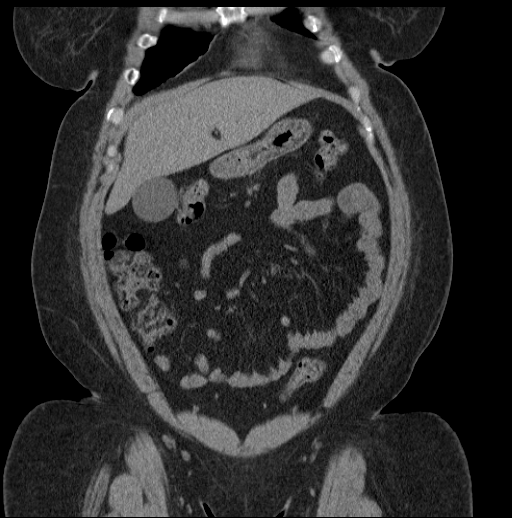
[im 45/102  soft-tissue]
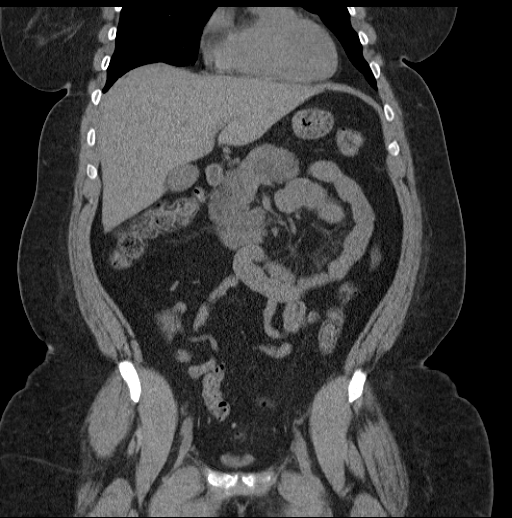
[im 57/102  soft-tissue]
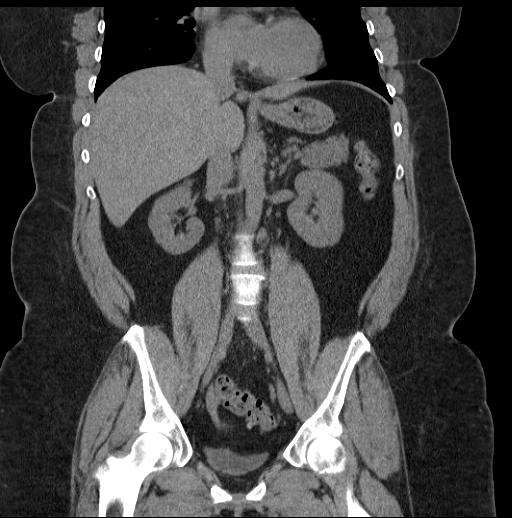

[17 of 46 positions shown; findings below may reference images not displayed]

FINDINGS: 4 mm nodule is noted in right lower lobe. No significant osseous
abnormality is noted.

No gallstones are noted. No focal abnormality is noted in the liver,
spleen or pancreas on these unenhanced images. Adrenal glands and
kidneys appear normal. No hydronephrosis or renal obstruction is
noted. No renal or ureteral calculi are noted. The appendix appears
normal. There is no evidence of bowel obstruction. No abnormal fluid
collection is noted. Urinary bladder is decompressed. Uterus and
ovaries appear normal. No significant adenopathy is noted.
IMPRESSION: 4 mm nodule is noted in right lower lobe. No follow-up needed if
patient is low-risk. Non-contrast chest CT can be considered in 12
months if patient is high-risk. This recommendation follows the
consensus statement: Guidelines for Management of Incidental
Pulmonary Nodules Detected on CT Images:From the [HOSPITAL]
2899; published online before print (10.1148/radiol.1109010134).

No hydronephrosis or renal obstruction is noted. No renal or
ureteral calculi are noted. No acute abnormality seen in the abdomen
or pelvis.

## 2018-05-09 ENCOUNTER — Other Ambulatory Visit (HOSPITAL_COMMUNITY): Payer: Self-pay | Admitting: Internal Medicine

## 2018-05-09 DIAGNOSIS — Z6841 Body Mass Index (BMI) 40.0 and over, adult: Secondary | ICD-10-CM | POA: Diagnosis not present

## 2018-05-09 DIAGNOSIS — Z124 Encounter for screening for malignant neoplasm of cervix: Secondary | ICD-10-CM | POA: Diagnosis not present

## 2018-05-09 DIAGNOSIS — Z1231 Encounter for screening mammogram for malignant neoplasm of breast: Secondary | ICD-10-CM

## 2018-05-09 DIAGNOSIS — Z1389 Encounter for screening for other disorder: Secondary | ICD-10-CM | POA: Diagnosis not present

## 2018-05-09 DIAGNOSIS — F329 Major depressive disorder, single episode, unspecified: Secondary | ICD-10-CM | POA: Diagnosis not present

## 2018-05-09 DIAGNOSIS — Z01411 Encounter for gynecological examination (general) (routine) with abnormal findings: Secondary | ICD-10-CM | POA: Diagnosis not present

## 2018-05-13 ENCOUNTER — Other Ambulatory Visit (HOSPITAL_COMMUNITY): Payer: Self-pay | Admitting: Internal Medicine

## 2018-05-13 ENCOUNTER — Ambulatory Visit (HOSPITAL_COMMUNITY)
Admission: RE | Admit: 2018-05-13 | Discharge: 2018-05-13 | Disposition: A | Discharge status: Home / Self Care | Payer: BLUE CROSS/BLUE SHIELD | Source: Ambulatory Visit | Attending: Internal Medicine | Admitting: Internal Medicine

## 2018-05-13 ENCOUNTER — Encounter (HOSPITAL_COMMUNITY): Payer: Self-pay | Admitting: Radiology

## 2018-05-13 DIAGNOSIS — Z1231 Encounter for screening mammogram for malignant neoplasm of breast: Secondary | ICD-10-CM

## 2018-05-27 DIAGNOSIS — E230 Hypopituitarism: Secondary | ICD-10-CM | POA: Diagnosis not present

## 2018-05-27 DIAGNOSIS — E119 Type 2 diabetes mellitus without complications: Secondary | ICD-10-CM | POA: Diagnosis not present

## 2018-05-27 DIAGNOSIS — E039 Hypothyroidism, unspecified: Secondary | ICD-10-CM | POA: Diagnosis not present

## 2018-05-30 DIAGNOSIS — R300 Dysuria: Secondary | ICD-10-CM | POA: Diagnosis not present

## 2018-05-30 DIAGNOSIS — Z1389 Encounter for screening for other disorder: Secondary | ICD-10-CM | POA: Diagnosis not present

## 2018-05-30 DIAGNOSIS — E063 Autoimmune thyroiditis: Secondary | ICD-10-CM | POA: Diagnosis not present

## 2018-05-30 DIAGNOSIS — Z6841 Body Mass Index (BMI) 40.0 and over, adult: Secondary | ICD-10-CM | POA: Diagnosis not present

## 2018-06-03 DIAGNOSIS — E2749 Other adrenocortical insufficiency: Secondary | ICD-10-CM | POA: Diagnosis not present

## 2018-06-03 DIAGNOSIS — Z0000 Encounter for general adult medical examination without abnormal findings: Secondary | ICD-10-CM | POA: Diagnosis not present

## 2018-06-03 DIAGNOSIS — Z1389 Encounter for screening for other disorder: Secondary | ICD-10-CM | POA: Diagnosis not present

## 2018-06-03 DIAGNOSIS — E232 Diabetes insipidus: Secondary | ICD-10-CM | POA: Diagnosis not present

## 2018-06-03 DIAGNOSIS — Z6841 Body Mass Index (BMI) 40.0 and over, adult: Secondary | ICD-10-CM | POA: Diagnosis not present

## 2018-06-03 DIAGNOSIS — D443 Neoplasm of uncertain behavior of pituitary gland: Secondary | ICD-10-CM | POA: Diagnosis not present

## 2018-06-03 DIAGNOSIS — E230 Hypopituitarism: Secondary | ICD-10-CM | POA: Diagnosis not present

## 2018-06-13 DIAGNOSIS — Z23 Encounter for immunization: Secondary | ICD-10-CM | POA: Diagnosis not present

## 2018-07-02 DIAGNOSIS — Z23 Encounter for immunization: Secondary | ICD-10-CM | POA: Diagnosis not present

## 2018-07-08 DIAGNOSIS — Z23 Encounter for immunization: Secondary | ICD-10-CM | POA: Diagnosis not present

## 2018-07-17 DIAGNOSIS — Z139 Encounter for screening, unspecified: Secondary | ICD-10-CM | POA: Diagnosis not present

## 2018-07-25 DIAGNOSIS — E039 Hypothyroidism, unspecified: Secondary | ICD-10-CM | POA: Diagnosis not present

## 2018-07-25 DIAGNOSIS — E230 Hypopituitarism: Secondary | ICD-10-CM | POA: Diagnosis not present

## 2018-08-02 DIAGNOSIS — Z23 Encounter for immunization: Secondary | ICD-10-CM | POA: Diagnosis not present

## 2018-08-23 DIAGNOSIS — J069 Acute upper respiratory infection, unspecified: Secondary | ICD-10-CM | POA: Diagnosis not present

## 2018-08-23 DIAGNOSIS — Z6841 Body Mass Index (BMI) 40.0 and over, adult: Secondary | ICD-10-CM | POA: Diagnosis not present

## 2018-08-23 DIAGNOSIS — J019 Acute sinusitis, unspecified: Secondary | ICD-10-CM | POA: Diagnosis not present

## 2018-10-16 DIAGNOSIS — E230 Hypopituitarism: Secondary | ICD-10-CM | POA: Diagnosis not present

## 2018-10-16 DIAGNOSIS — E039 Hypothyroidism, unspecified: Secondary | ICD-10-CM | POA: Diagnosis not present

## 2019-06-25 ENCOUNTER — Other Ambulatory Visit (HOSPITAL_COMMUNITY): Payer: Self-pay | Admitting: Internal Medicine

## 2019-06-25 DIAGNOSIS — Z1231 Encounter for screening mammogram for malignant neoplasm of breast: Secondary | ICD-10-CM

## 2023-05-09 ENCOUNTER — Other Ambulatory Visit (HOSPITAL_COMMUNITY): Payer: Self-pay | Admitting: Internal Medicine

## 2023-05-09 ENCOUNTER — Ambulatory Visit (HOSPITAL_COMMUNITY)
Admission: RE | Admit: 2023-05-09 | Discharge: 2023-05-09 | Disposition: A | Discharge status: Home / Self Care | Payer: BC Managed Care – PPO | Source: Ambulatory Visit | Attending: Internal Medicine | Admitting: Internal Medicine

## 2023-05-09 DIAGNOSIS — R059 Cough, unspecified: Secondary | ICD-10-CM | POA: Insufficient documentation
